# Patient Record
Sex: Male | Born: 1990 | Race: Black or African American | Hispanic: No | Marital: Single | Smoking: Current some day smoker
Health system: Southern US, Community
[De-identification: ages and names within clinical notes are randomized; demographics above are authoritative.]

## PROBLEM LIST (undated history)

## (undated) DIAGNOSIS — M419 Scoliosis, unspecified: Secondary | ICD-10-CM

---

## 2015-12-02 ENCOUNTER — Emergency Department (HOSPITAL_COMMUNITY): Payer: 59

## 2015-12-02 ENCOUNTER — Encounter (HOSPITAL_COMMUNITY): Payer: Self-pay | Admitting: Emergency Medicine

## 2015-12-02 ENCOUNTER — Emergency Department (HOSPITAL_COMMUNITY)
Admission: EM | Admit: 2015-12-02 | Discharge: 2015-12-02 | Disposition: A | Discharge status: Home / Self Care | Payer: 59 | Attending: Emergency Medicine | Admitting: Emergency Medicine

## 2015-12-02 DIAGNOSIS — W500XXA Accidental hit or strike by another person, initial encounter: Secondary | ICD-10-CM | POA: Insufficient documentation

## 2015-12-02 DIAGNOSIS — S99911A Unspecified injury of right ankle, initial encounter: Secondary | ICD-10-CM | POA: Diagnosis present

## 2015-12-02 DIAGNOSIS — S93401A Sprain of unspecified ligament of right ankle, initial encounter: Secondary | ICD-10-CM | POA: Diagnosis not present

## 2015-12-02 DIAGNOSIS — Y929 Unspecified place or not applicable: Secondary | ICD-10-CM | POA: Insufficient documentation

## 2015-12-02 DIAGNOSIS — F17200 Nicotine dependence, unspecified, uncomplicated: Secondary | ICD-10-CM | POA: Insufficient documentation

## 2015-12-02 DIAGNOSIS — Y998 Other external cause status: Secondary | ICD-10-CM | POA: Insufficient documentation

## 2015-12-02 DIAGNOSIS — Y9361 Activity, american tackle football: Secondary | ICD-10-CM | POA: Diagnosis not present

## 2015-12-02 NOTE — ED Triage Notes (Signed)
Pt from home following an ankle injury that he incurred while playing football around 2100. Pt states he had his weight on his right foot and was hit by other players. Pt states the lateral side of his right ankle is where his pain is localized. Pt rates his pain at 8/10. Pt states he is unable to bear weight on that foot

## 2015-12-02 NOTE — Discharge Instructions (Signed)
You can take ibuprofen, available over the counter, for pain.

## 2015-12-02 NOTE — ED Notes (Signed)
Bed: WA20 Expected date:  Expected time:  Means of arrival:  Comments: 

## 2015-12-02 NOTE — ED Provider Notes (Signed)
[facility]-EMERGENCY DEPT Provider Note   CSN: [id number] Arrival date & time: 12/02/15  [id number]  By signing my name below, I, Soijett Blue, attest that this documentation has been prepared under the direction and in the presence of Tilden FossaElizabeth Yamaris Cummings, MD. Electronically Signed: Soijett Blue, ED Scribe. 12/02/15. 1:20 AM.   History   Chief Complaint Chief Complaint  Patient presents with  . Ankle Injury    HPI Jesus HarpsXavier Moster is a 25 y.o. male who presents to the Emergency Department complaining of right ankle injury occurring 9 PM yesterday. Pt notes that he was playing football when he planted his feet to cut quickly when he was tackled. Pt reports that his right ankle pain is localized to the lateral side. Pt states that he has injured this ankle in the past with a similar mechanism. Pt is having associated symptoms of gait problem due to pain and mild right ankle swelling. He notes that he has tried ice without medications for the relief of his symptoms. He denies right knee pain, color change, wound, rash, and any other symptoms. Denies ever having surgery in the past. Pt denies having an orthopedist.   The history is provided by the patient. No language interpreter was used.    History reviewed. No pertinent past medical history.  There are no active problems to display for this patient.   History reviewed. No pertinent surgical history.     Home Medications    Prior to Admission medications   Not on File    Family History No family history on file.  Social History Social History  Substance Use Topics  . Smoking status: Current Some Day Smoker  . Smokeless tobacco: Never Used  . Alcohol use Yes     Comment: socially     Allergies   Review of patient's allergies indicates no known allergies.   Review of Systems Review of Systems  Musculoskeletal: Positive for arthralgias (right ankle), gait problem (due to pain) and joint swelling (right ankle).  Skin: Negative for  color change, rash and wound.     Physical Exam Updated Vital Signs BP 122/61 (BP Location: Right Arm)   Pulse 68   Temp 98.9 F (37.2 C) (Oral)   Resp 13   Ht 5\' 8"  (1.727 m)   Wt 162 lb (73.5 kg)   SpO2 98%   BMI 24.63 kg/m   Physical Exam  Constitutional: He is oriented to person, place, and time. He appears well-developed and well-nourished. No distress.  HENT:  Head: Normocephalic and atraumatic.  Neck: Neck supple.  Cardiovascular: Normal rate and regular rhythm.   Pulmonary/Chest: Effort normal. No respiratory distress.  Musculoskeletal:  2+ DP pulses bilaterally. Mild swelling and tenderness to the right, lateral malleolus. Dorsi flexion and plantar flexion intact to the right foot and ankle. No tenderness to the knee.  Neurological: He is alert and oriented to person, place, and time.  Sensation to light touch intact throughout the foot. Wiggles toes.  Skin: Skin is warm and dry. Capillary refill takes less than 2 seconds.  Nursing note and vitals reviewed.    ED Treatments / Results  DIAGNOSTIC STUDIES: Oxygen Saturation is 98% on RA, nl by my interpretation.    COORDINATION OF CARE: 1:15 AM Discussed treatment plan with pt at bedside which includes right ankle xray, RICE, crutches, ibuprofen, referral and follow up with orthopedist, and pt agreed to plan.   Radiology Dg Ankle Complete Right  Result Date: 12/02/2015 CLINICAL DATA:  Football injury. Lateral  right ankle pain after twisting injury. Previous history of sprained right ankles. EXAM: RIGHT ANKLE - COMPLETE 3+ VIEW COMPARISON:  None. FINDINGS: There is no evidence of fracture, dislocation, or joint effusion. There is no evidence of arthropathy or other focal bone abnormality. Soft tissues are unremarkable. IMPRESSION: Negative. Electronically Signed   By: Burman Nieves M.D.   On: 12/02/2015 01:21    Procedures Procedures (including critical care time)  Medications Ordered in ED Medications - No  data to display   Initial Impression / Assessment and Plan / ED Course  I have reviewed the triage vital signs and the nursing notes.  Pertinent imaging results that were available during my care of the patient were reviewed by me and considered in my medical decision making (see chart for details).  Clinical Course    Patient here for evaluation of right ankle pain following an eversion injury. He has tenderness to the lateral malleolus with mild local swelling. Plain films are negative for fracture. Will treat for sprain. Discussed home care, outpatient follow-up, return precautions.  Final Clinical Impressions(s) / ED Diagnoses   Final diagnoses:  Right ankle sprain, initial encounter    New Prescriptions There are no discharge medications for this patient.  I personally performed the services described in this documentation, which was scribed in my presence. The recorded information has been reviewed and is accurate.    Tilden Fossa, MD 12/02/15 [phone]

## 2016-10-19 ENCOUNTER — Emergency Department (HOSPITAL_COMMUNITY): Payer: 59

## 2016-10-19 ENCOUNTER — Encounter (HOSPITAL_COMMUNITY): Payer: Self-pay | Admitting: Emergency Medicine

## 2016-10-19 ENCOUNTER — Emergency Department (HOSPITAL_COMMUNITY)
Admission: EM | Admit: 2016-10-19 | Discharge: 2016-10-19 | Disposition: A | Discharge status: Home / Self Care | Payer: 59 | Attending: Emergency Medicine | Admitting: Emergency Medicine

## 2016-10-19 DIAGNOSIS — R42 Dizziness and giddiness: Secondary | ICD-10-CM | POA: Insufficient documentation

## 2016-10-19 DIAGNOSIS — R110 Nausea: Secondary | ICD-10-CM | POA: Insufficient documentation

## 2016-10-19 DIAGNOSIS — R51 Headache: Secondary | ICD-10-CM | POA: Insufficient documentation

## 2016-10-19 DIAGNOSIS — W500XXA Accidental hit or strike by another person, initial encounter: Secondary | ICD-10-CM | POA: Insufficient documentation

## 2016-10-19 DIAGNOSIS — H538 Other visual disturbances: Secondary | ICD-10-CM | POA: Insufficient documentation

## 2016-10-19 DIAGNOSIS — F17210 Nicotine dependence, cigarettes, uncomplicated: Secondary | ICD-10-CM | POA: Insufficient documentation

## 2016-10-19 DIAGNOSIS — S060X0A Concussion without loss of consciousness, initial encounter: Secondary | ICD-10-CM

## 2016-10-19 DIAGNOSIS — Y9361 Activity, american tackle football: Secondary | ICD-10-CM | POA: Insufficient documentation

## 2016-10-19 DIAGNOSIS — M542 Cervicalgia: Secondary | ICD-10-CM | POA: Insufficient documentation

## 2016-10-19 DIAGNOSIS — Y999 Unspecified external cause status: Secondary | ICD-10-CM | POA: Insufficient documentation

## 2016-10-19 DIAGNOSIS — Y92321 Football field as the place of occurrence of the external cause: Secondary | ICD-10-CM | POA: Insufficient documentation

## 2016-10-19 MED ORDER — SODIUM CHLORIDE 0.9 % IV BOLUS (SEPSIS)
1000.0000 mL | Freq: Once | INTRAVENOUS | Status: AC
  Administered 2016-10-19: 1000 mL via INTRAVENOUS

## 2016-10-19 MED ORDER — METOCLOPRAMIDE HCL 5 MG/ML IJ SOLN
10.0000 mg | Freq: Once | INTRAMUSCULAR | Status: AC
  Administered 2016-10-19: 10 mg via INTRAVENOUS
  Filled 2016-10-19: qty 2

## 2016-10-19 MED ORDER — KETOROLAC TROMETHAMINE 30 MG/ML IJ SOLN
15.0000 mg | Freq: Once | INTRAMUSCULAR | Status: DC
  Filled 2016-10-19: qty 1

## 2016-10-19 MED ORDER — DIPHENHYDRAMINE HCL 50 MG/ML IJ SOLN
12.5000 mg | Freq: Once | INTRAMUSCULAR | Status: AC
  Administered 2016-10-19: 12.5 mg via INTRAVENOUS
  Filled 2016-10-19: qty 1

## 2016-10-19 NOTE — ED Triage Notes (Signed)
Pt c/o headache, neck pain and stiffness, bilateral blurred vision throughout visual field, dizziness intermittent nausea onset yesterday during football game last night around 2130. Family member reports pt struck another player's chest with his head, then pt's head "whipped back," hit looked hard. Pt's symptoms started sometime after that. No LOC. Multiple episodes emesis with mid to upper abdominal pain yesterday morning before game.  Hx 9 prior concussions. Decreased hearing right ear compared to left. CN II-XII otherwise intact. Sensation intact and symmetrical.

## 2016-10-19 NOTE — Discharge Instructions (Signed)
Make sure you call the concussion clinic hotline [phone].  You have been diagnosed with a concussion.  Ibuprofen or Tylenol for pain Rest, ice on head.  Stay in a quiet, non-simulating, dark environment. No TV, computer use, video games until headache is resolved completely. No contact sports until cleared by the clinic Follow up with your primary care physician if headache persists.  Return to the emergency department if patient becomes lethargic, begins vomiting or other change in mental status.  HEAD INJURY If any of the following occur notify your physician or go to the Hospital Emergency Department:  Increased drowsiness, stupor or loss of consciousness  Temperature above 100 F  Vomiting  Severe headache  Blood or clear fluid dripping from the nose or ears  Stiffness of the neck  Dizziness or blurred vision  Any other unusual symptoms  PRECAUTIONS  Keep head elevated at all times for the first 24 hours (Elevate mattress if pillow is ineffective)  Do not take sedatives, narcotics or alcohol  Avoid aspirin. Use only acetaminophen (e.g. Tylenol) or ibuprofen (e.g. Advil) for relief of pain. Follow directions on the bottle for dosage.  Use ice packs for comfort

## 2016-10-19 NOTE — ED Provider Notes (Signed)
[facility]-EMERGENCY DEPT Provider Note   CSN: [id number] Arrival date & time: 10/19/16  1238     History   Chief Complaint Chief Complaint  Patient presents with  . Headache  . Head Injury    HPI Jesus Holloway is a 26 y.o. male.  HPI 26 year old American male past medical history is significant for concussion presents to the ED today with concussion-like symptoms complaints. Patient states that he was playing football yesterday when he struck another player's chest with his head. States that "his head whipped back". Patient reports intermittent headaches, intermittent blurred vision, dizziness, nausea. Patient has a history of concussions and states this feels similar. He denies any LOC. History of benign prior concussions. He reports several episodes of nonbloody nonbloody emesis yesterday but denies any emesis today. Reports nausea. He does report some decreased hearing out of his right ear compared to the left. Patient complains of neck soreness and stiffness. He is not taking any vertigo symptoms at home prior to arrival. Nothing makes better or worse. States the symptoms have slightly subsided since arriving to the ED. Does report headache persisting.  Pt denies any fever, chill, ha, vision changes, lightheadedness, congestion, neck pain, cp, sob, cough, abd pain, n/v/d, urinary symptoms, change in bowel habits, melena, hematochezia, lower extremity paresthesias, back pain, saddle paresthesias, urinary retention, loss of bowel or bladder.  History reviewed. No pertinent past medical history.  There are no active problems to display for this patient.   History reviewed. No pertinent surgical history.     Home Medications    Prior to Admission medications   Not on File    Family History History reviewed. No pertinent family history.  Social History Social History  Substance Use Topics  . Smoking status: Current Some Day Smoker  . Smokeless tobacco: Never Used  . Alcohol  use Yes     Comment: socially     Allergies   Patient has no known allergies.   Review of Systems Review of Systems  Constitutional: Negative for chills and fever.  HENT: Negative for congestion and sore throat.   Eyes: Positive for photophobia and visual disturbance.  Respiratory: Negative for cough and shortness of breath.   Cardiovascular: Negative for chest pain.  Gastrointestinal: Positive for nausea and vomiting. Negative for abdominal pain and diarrhea.  Genitourinary: Negative for dysuria, flank pain, frequency, hematuria, scrotal swelling, testicular pain and urgency.  Musculoskeletal: Positive for neck pain and neck stiffness. Negative for arthralgias, back pain, gait problem, joint swelling and myalgias.  Skin: Negative for rash.  Neurological: Positive for dizziness and headaches. Negative for syncope, weakness, light-headedness and numbness.  Psychiatric/Behavioral: Negative for sleep disturbance. The patient is not nervous/anxious.      Physical Exam Updated Vital Signs BP 128/81 (BP Location: Left Arm)   Pulse (!) 54   Temp 98 F (36.7 C) (Oral)   Resp 16   SpO2 99%   Physical Exam  Constitutional: He is oriented to person, place, and time. He appears well-developed and well-nourished.  Non-toxic appearance. No distress.  HENT:  Head: Normocephalic and atraumatic.  Mouth/Throat: Oropharynx is clear and moist.  No bilateral hemotympanum or septal hematoma.  Eyes: Pupils are equal, round, and reactive to light. Conjunctivae and EOM are normal. Right eye exhibits no discharge. Left eye exhibits no discharge.  Neck: Normal range of motion. Neck supple.  No c spine midline tenderness. Bilateral paraspinal tenderness. No deformities or step offs noted. Full ROM. Supple. No nuchal rigidity.  Cardiovascular: Normal rate, regular rhythm, normal heart sounds and intact distal pulses.  Exam reveals no gallop and no friction rub.   No murmur heard. Pulmonary/Chest:  Effort normal and breath sounds normal. No respiratory distress. He exhibits no tenderness.  Abdominal: Soft. Bowel sounds are normal. He exhibits no distension. There is no tenderness. There is no rebound and no guarding.  Musculoskeletal: Normal range of motion. He exhibits no tenderness.  No midline T spine or L spine tenderness. No deformities or step offs noted. Full ROM. Pelvis is stable.   Lymphadenopathy:    He has no cervical adenopathy.  Neurological: He is alert and oriented to person, place, and time.  The patient is alert, attentive, and oriented x 3. Speech is clear. Cranial nerve II-VII grossly intact. Negative pronator drift. Sensation intact. Strength 5/5 in all extremities. Reflexes 2+ and symmetric at biceps, triceps, knees, and ankles. Rapid alternating movement and fine finger movements intact. Romberg is absent. Posture and gait normal.   Skin: Skin is warm and dry. Capillary refill takes less than 2 seconds. No rash noted.  Psychiatric: His behavior is normal. Judgment and thought content normal.  Nursing note and vitals reviewed.    ED Treatments / Results  Labs (all labs ordered are listed, but only abnormal results are displayed) Labs Reviewed - No data to display  EKG  EKG Interpretation None       Radiology Ct Head Wo Contrast  Result Date: 10/19/2016 CLINICAL DATA:  Headache neck pain, stiffness and blurred vision which began yesterday while playing football. EXAM: CT HEAD WITHOUT CONTRAST CT CERVICAL SPINE WITHOUT CONTRAST TECHNIQUE: Multidetector CT imaging of the head and cervical spine was performed following the standard protocol without intravenous contrast. Multiplanar CT image reconstructions of the cervical spine were also generated. COMPARISON:  None. FINDINGS: CT HEAD FINDINGS Brain: Appears normal without hemorrhage, infarct, mass lesion, mass effect, midline shift or abnormal extra-axial fluid collection. No hydrocephalus or pneumocephalus.  Vascular: Negative. Skull: And intact. Sinuses/Orbits: Negative. Other: None. CT CERVICAL SPINE FINDINGS Alignment: Normal. Skull base and vertebrae: No acute fracture. No primary bone lesion or focal pathologic process. Soft tissues and spinal canal: No prevertebral fluid or swelling. No visible canal hematoma. Disc levels:  Disc height is maintained throughout. Upper chest: Negative. Other: None. IMPRESSION: Negative head and cervical spine CT scans. Electronically Signed   By: Drusilla Kanner M.D.   On: 10/19/2016 14:04   Ct Cervical Spine Wo Contrast  Result Date: 10/19/2016 CLINICAL DATA:  Headache neck pain, stiffness and blurred vision which began yesterday while playing football. EXAM: CT HEAD WITHOUT CONTRAST CT CERVICAL SPINE WITHOUT CONTRAST TECHNIQUE: Multidetector CT imaging of the head and cervical spine was performed following the standard protocol without intravenous contrast. Multiplanar CT image reconstructions of the cervical spine were also generated. COMPARISON:  None. FINDINGS: CT HEAD FINDINGS Brain: Appears normal without hemorrhage, infarct, mass lesion, mass effect, midline shift or abnormal extra-axial fluid collection. No hydrocephalus or pneumocephalus. Vascular: Negative. Skull: And intact. Sinuses/Orbits: Negative. Other: None. CT CERVICAL SPINE FINDINGS Alignment: Normal. Skull base and vertebrae: No acute fracture. No primary bone lesion or focal pathologic process. Soft tissues and spinal canal: No prevertebral fluid or swelling. No visible canal hematoma. Disc levels:  Disc height is maintained throughout. Upper chest: Negative. Other: None. IMPRESSION: Negative head and cervical spine CT scans. Electronically Signed   By: Drusilla Kanner M.D.   On: 10/19/2016 14:04    Procedures Procedures (including critical care time)  Medications Ordered in ED Medications  ketorolac (TORADOL) 30 MG/ML injection 15 mg (15 mg Intravenous Not Given 10/19/16 1556)  sodium chloride 0.9  % bolus 1,000 mL (0 mLs Intravenous Stopped 10/19/16 1556)  metoCLOPramide (REGLAN) injection 10 mg (10 mg Intravenous Given 10/19/16 1423)  diphenhydrAMINE (BENADRYL) injection 12.5 mg (12.5 mg Intravenous Given 10/19/16 1423)     Initial Impression / Assessment and Plan / ED Course  I have reviewed the triage vital signs and the nursing notes.  Pertinent labs & imaging results that were available during my care of the patient were reviewed by me and considered in my medical decision making (see chart for details).     Patient symptoms consistent with concussion. No vomiting. No focal neurological deficits on physical exam.  Pt observed in the ED. Accident occurred yesterday. CT scan shows no acute abnormalities Discussed symptoms of post concussive syndrome and reasons to return to the emergency department including any new  severe headaches, disequilibrium, vomiting, double vision, extremity weakness, difficulty ambulating, or any other concerning symptoms. Patient will be discharged with information pertaining to diagnosis. Pt advised to avoid all contact sports and will need PCP clearance to return to PE and sports.  Pt is safe for discharge at this time.   Final Clinical Impressions(s) / ED Diagnoses   Final diagnoses:  Concussion without loss of consciousness, initial encounter    New Prescriptions There are no discharge medications for this patient.    Rise Mu, PA-C 10/19/16 1603    Vanetta Mulders, MD 10/20/16 [phone]

## 2017-07-04 ENCOUNTER — Emergency Department (HOSPITAL_COMMUNITY)
Admission: EM | Admit: 2017-07-04 | Discharge: 2017-07-04 | Disposition: A | Discharge status: Home / Self Care | Payer: BLUE CROSS/BLUE SHIELD | Attending: Emergency Medicine | Admitting: Emergency Medicine

## 2017-07-04 ENCOUNTER — Encounter (HOSPITAL_COMMUNITY): Payer: Self-pay | Admitting: Emergency Medicine

## 2017-07-04 DIAGNOSIS — K0889 Other specified disorders of teeth and supporting structures: Secondary | ICD-10-CM | POA: Diagnosis present

## 2017-07-04 DIAGNOSIS — F17210 Nicotine dependence, cigarettes, uncomplicated: Secondary | ICD-10-CM | POA: Insufficient documentation

## 2017-07-04 MED ORDER — PENICILLIN V POTASSIUM 500 MG PO TABS: 500.0000 mg | ORAL_TABLET | Freq: Four times a day (QID) | ORAL | 0 refills | Status: AC

## 2017-07-04 NOTE — Discharge Instructions (Addendum)
Take antibiotic as prescribed.  You can take Aleve 500 mg twice a day for pain.  Please use dental resource guide to find a local dentist to follow-up with.  Your blood pressure was elevated in the ER today, please have this rechecked by her primary doctor.  As we discussed, return to the emergency department for any new or worsening symptoms including fever, trouble breathing or swallowing, neck swelling/pain.

## 2017-07-04 NOTE — ED Triage Notes (Signed)
Pt c/o left lower dental pain that has been on going for over 3 weeks. reports that tooth is cracked.

## 2017-07-04 NOTE — ED Provider Notes (Signed)
[facility]-EMERGENCY DEPT Provider Note   CSN: [id number] Arrival date & time: 07/04/17  1124     History   Chief Complaint Chief Complaint  Patient presents with  . Dental Pain    HPI Jesus Holloway is a 27 y.o. male.  HPI   Jesus Holloway is a 27 year old male with no syncope past medical history who presents to the emergency department for evaluation of left lower dental pain.  Patient reports that he chipped 1 of his left lower molars 3 months ago.  Did not start developing pain until about 2 weeks ago which has been gradually getting worse.  He states that he has 7/10 severity, "throbbing" left lower dental pain.  It is worsened with chewing, or cold air.  She has been taking Aleve, ibuprofen, Goody's powder, applying Orajel and has had only temporary relief.  He states that he has Jesus Holloway, but does not have a dentist.  He denies fevers, chills, neck pain/swelling, trismus, trouble breathing or swallowing, nausea/vomiting.   History reviewed. No pertinent past medical history.  There are no active problems to display for this patient.   History reviewed. No pertinent surgical history.      Home Medications    Prior to Admission medications   Not on File    Family History No family history on file.  Social History Social History   Tobacco Use  . Smoking status: Current Some Day Smoker    Types: Cigarettes  . Smokeless tobacco: Never Used  Substance Use Topics  . Alcohol use: Yes    Comment: socially  . Drug use: Not on file     Allergies   Patient has no known allergies.   Review of Systems Review of Systems  Constitutional: Negative for chills and fever.  HENT: Positive for dental problem. Negative for facial swelling and trouble swallowing.   Respiratory: Negative for shortness of breath.   Gastrointestinal: Negative for nausea and vomiting.     Physical Exam Updated Vital Signs BP (!) 142/81 (BP Location: Right  Arm)   Pulse 60   Temp 97.9 F (36.6 C) (Oral)   Resp 18   Ht 5\' 8"  (1.727 m)   Wt 78.5 kg (173 lb)   SpO2 100%   BMI 26.30 kg/m   Physical Exam  Constitutional: He appears well-developed and well-nourished. No distress.  Sitting at bedside in no apparent distress, nontoxic.  HENT:  Head: Normocephalic and atraumatic.  Mouth/Throat: Oropharynx is clear and moist. No oropharyngeal exudate.    Dental cavities and poor oral dentition noted. Pain along tooth which is cracked as depicted in image. No abscess noted. Midline uvula. No trismus. OP moist and clear. No oropharyngeal erythema or edema. Neck supple with no tenderness. No facial edema.  Eyes: Right eye exhibits no discharge. Left eye exhibits no discharge.  Neck: Normal range of motion. Neck supple.  Pulmonary/Chest: Effort normal. No respiratory distress.  Neurological: He is alert. Coordination normal.  Skin: He is not diaphoretic.  Psychiatric: He has a normal mood and affect. His behavior is normal.  Nursing note and vitals reviewed.    ED Treatments / Results  Labs (all labs ordered are listed, but only abnormal results are displayed) Labs Reviewed - No data to display  EKG None  Radiology No results found.  Procedures Procedures (including critical care time)  Medications Ordered in ED Medications - No data to display   Initial Impression / Assessment and Plan / ED Course  I have reviewed the triage vital signs and the nursing notes.  Pertinent labs & imaging results that were available during my care of the patient were reviewed by me and considered in my medical decision making (see chart for details).     Patient with toothache.  No trouble breathing or swallowing.  No gross abscess.  Exam unconcerning for Ludwig's angina, PTA or spread of infection.  Will treat with penicillin and anti-inflammatories medicine.  Urged patient to follow-up with dentist and provided with dental resource guide with his  discharge paperwork.  His blood pressure is mildly elevated, counseled him to have this rechecked with his primary care doctor.  Discussed reasons to return to the emergency department.  Patient agrees and voiced understanding the above plan and appears reliable to follow-up.  Final Clinical Impressions(s) / ED Diagnoses   Final diagnoses:  Pain, dental    ED Discharge Orders        Ordered    penicillin v potassium (VEETID) 500 MG tablet  4 times daily     07/04/17 1430       Kellie ShropshireShrosbree, Jinnie Onley J, [place] JerseyPA-C 07/04/17 1431    Tegeler, Canary Brimhristopher J, MD 07/04/17 [phone]

## 2018-07-28 IMAGING — CT CT CERVICAL SPINE W/O CM
4 of 8 series · 12 of 33 positions shown, 13 images · non-contrast
Comparison: None.

CLINICAL DATA: Headache neck pain, stiffness and blurred vision
which began yesterday while playing football.

EXAM:
CT HEAD WITHOUT CONTRAST
CT CERVICAL SPINE WITHOUT CONTRAST
TECHNIQUE: Multidetector CT imaging of the head and cervical spine was
performed following the standard protocol without intravenous
contrast. Multiplanar CT image reconstructions of the cervical spine
were also generated.

[Series 6: sagittal · sagittal · 0.31mm/px · 5 of 74 slices shown]
[im 13/74  bone]
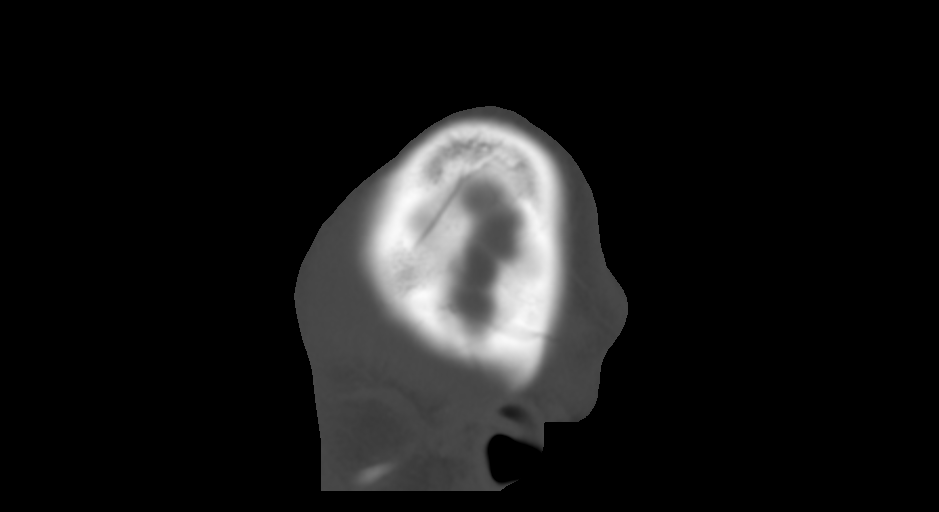
[im 25/74  bone]
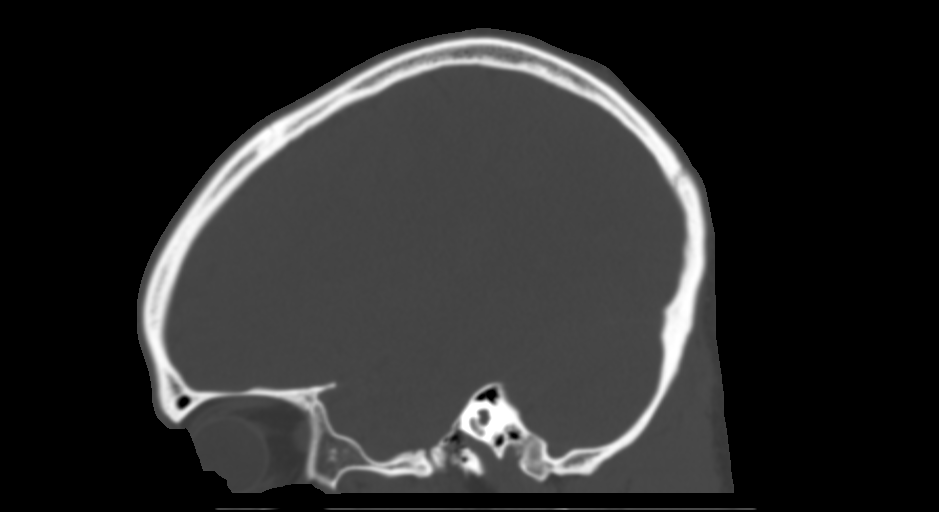
[im 37/74  bone]
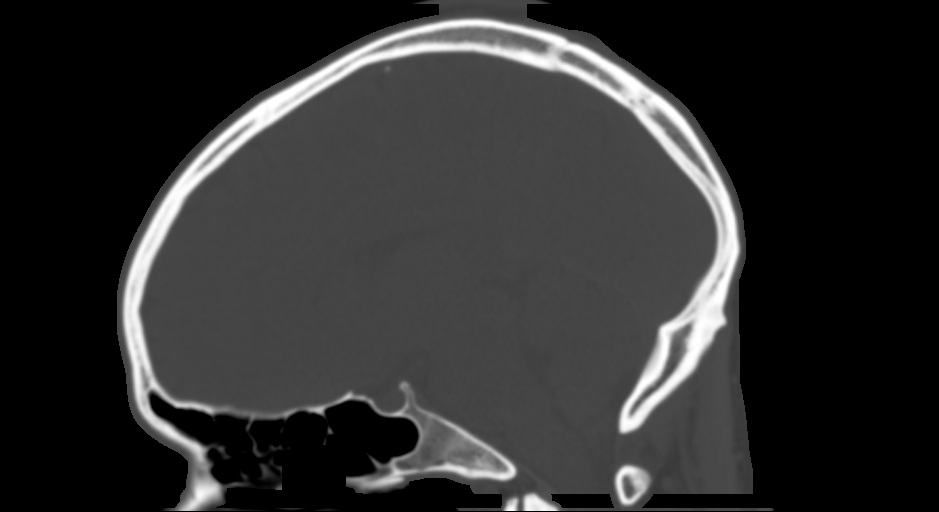
[im 49/74  bone]
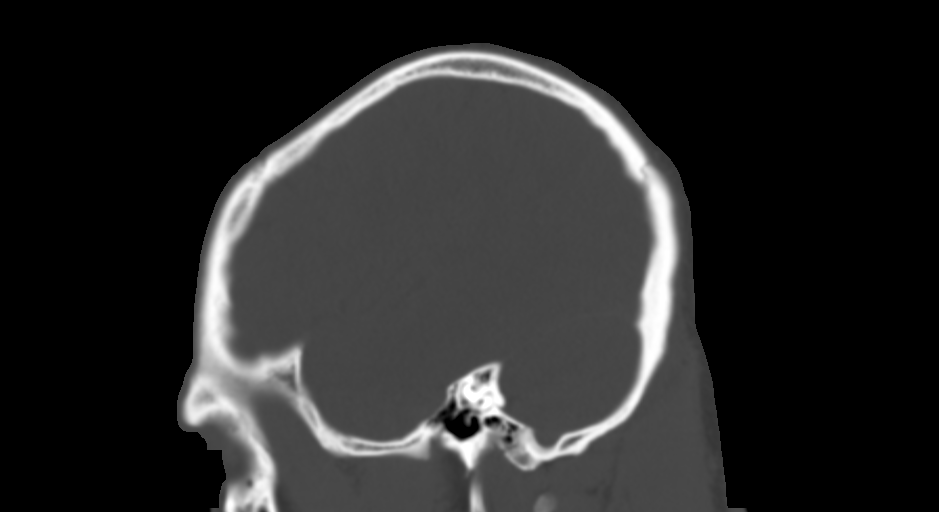
[im 61/74  bone]
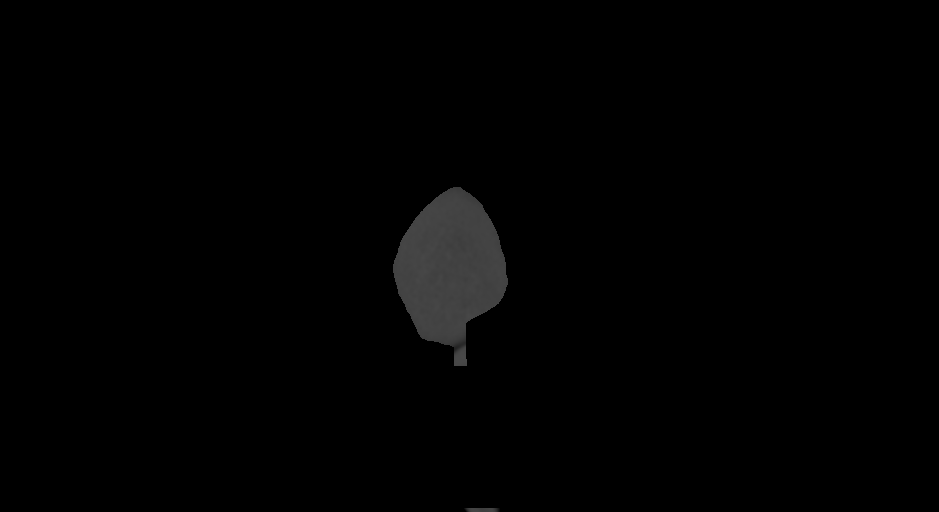

[Series 7: c-spine st · axial · 0.29mm/px · z∈[+1176,+1256]mm · 3 of 81 slices shown, 4 images]
[im 21/81  soft-tissue]
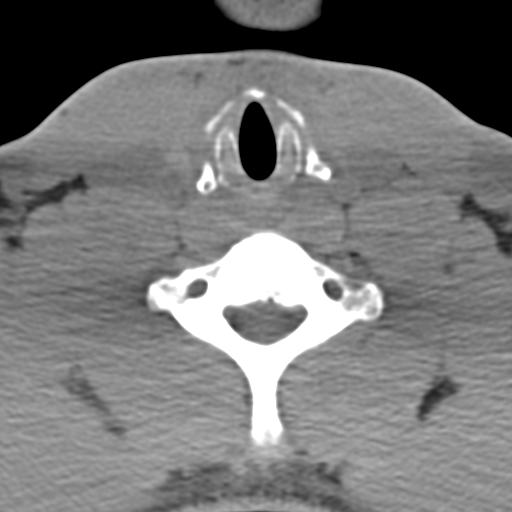
[im 21/81  bone]
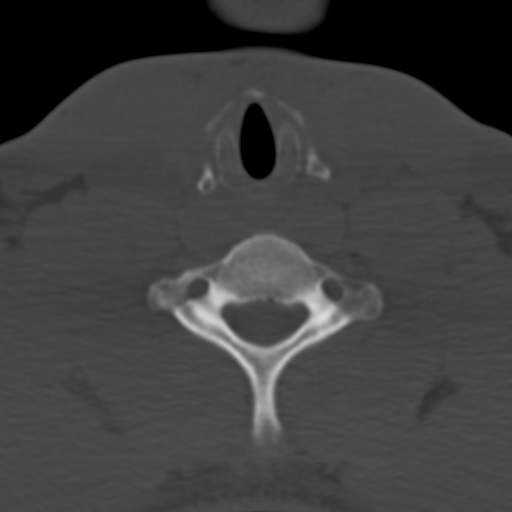
[im 41/81  bone]
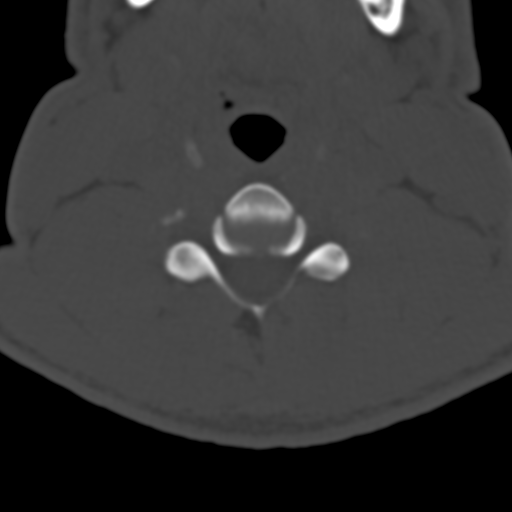
[im 61/81  bone]
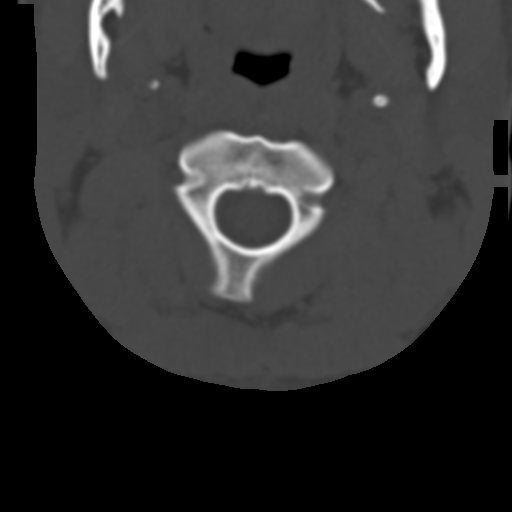

[Series 10: axial recon · axial · 0.23mm/px · z∈[+1162,+1246]mm · 3 of 87 slices shown]
[im 22/87  bone]
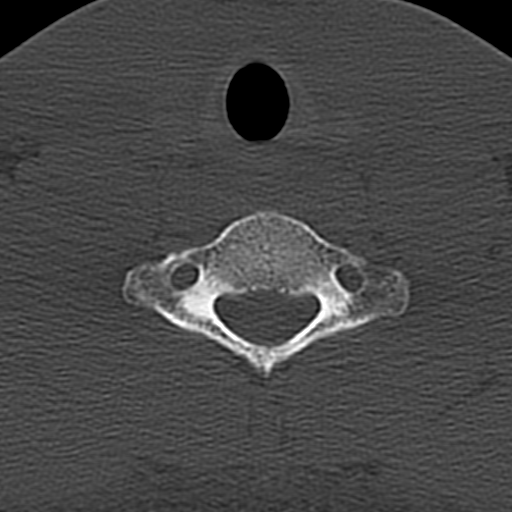
[im 44/87  bone]
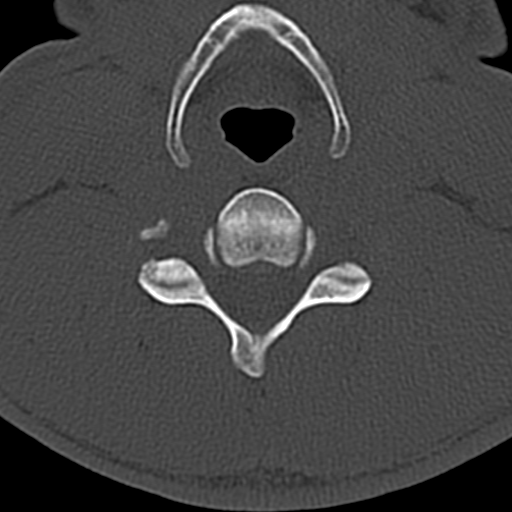
[im 65/87  bone]
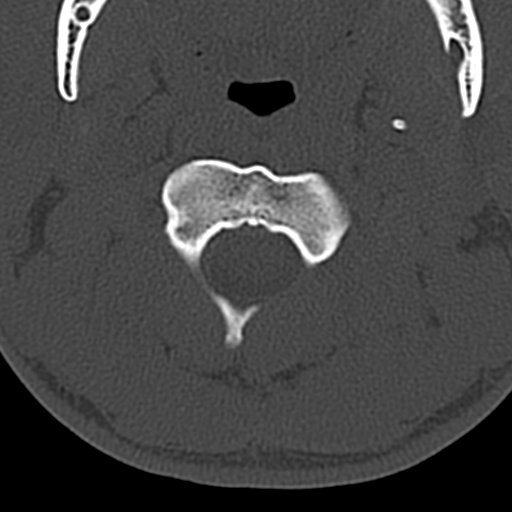

[Series 11: coronal · coronal · 0.23mm/px · 1 of 61 slices shown]
[im 31/61  bone]
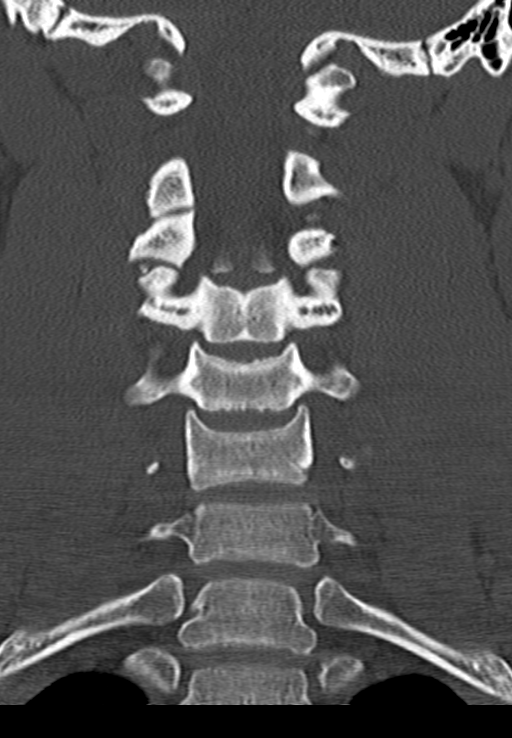

[12 of 33 positions shown; findings below may reference images not displayed]

FINDINGS: CT HEAD FINDINGS

Brain: Appears normal without hemorrhage, infarct, mass lesion, mass
effect, midline shift or abnormal extra-axial fluid collection. No
hydrocephalus or pneumocephalus.

Vascular: Negative.

Skull: And intact.

Sinuses/Orbits: Negative.

Other: None.

CT CERVICAL SPINE FINDINGS

Alignment: Normal.

Skull base and vertebrae: No acute fracture. No primary bone lesion
or focal pathologic process.

Soft tissues and spinal canal: No prevertebral fluid or swelling. No
visible canal hematoma.

Disc levels:  Disc height is maintained throughout.

Upper chest: Negative.

Other: None.
IMPRESSION: Negative head and cervical spine CT scans.

## 2019-01-04 ENCOUNTER — Encounter (HOSPITAL_COMMUNITY): Payer: Self-pay

## 2019-01-04 ENCOUNTER — Ambulatory Visit (HOSPITAL_COMMUNITY)
Admission: EM | Admit: 2019-01-04 | Discharge: 2019-01-04 | Disposition: A | Discharge status: Home / Self Care | Payer: Managed Care, Other (non HMO) | Attending: Emergency Medicine | Admitting: Emergency Medicine

## 2019-01-04 DIAGNOSIS — R197 Diarrhea, unspecified: Secondary | ICD-10-CM | POA: Diagnosis not present

## 2019-01-04 DIAGNOSIS — Z20828 Contact with and (suspected) exposure to other viral communicable diseases: Secondary | ICD-10-CM | POA: Diagnosis not present

## 2019-01-04 DIAGNOSIS — R112 Nausea with vomiting, unspecified: Secondary | ICD-10-CM | POA: Insufficient documentation

## 2019-01-04 DIAGNOSIS — M791 Myalgia, unspecified site: Secondary | ICD-10-CM | POA: Diagnosis not present

## 2019-01-04 DIAGNOSIS — M549 Dorsalgia, unspecified: Secondary | ICD-10-CM | POA: Diagnosis not present

## 2019-01-04 DIAGNOSIS — F17210 Nicotine dependence, cigarettes, uncomplicated: Secondary | ICD-10-CM | POA: Diagnosis not present

## 2019-01-04 DIAGNOSIS — R51 Headache: Secondary | ICD-10-CM | POA: Insufficient documentation

## 2019-01-04 DIAGNOSIS — Z79899 Other long term (current) drug therapy: Secondary | ICD-10-CM | POA: Diagnosis not present

## 2019-01-04 DIAGNOSIS — Z20822 Contact with and (suspected) exposure to covid-19: Secondary | ICD-10-CM

## 2019-01-04 MED ORDER — ONDANSETRON HCL 4 MG PO TABS: 4.0000 mg | ORAL_TABLET | Freq: Four times a day (QID) | ORAL | 0 refills | Status: DC

## 2019-01-04 NOTE — ED Provider Notes (Signed)
[facility]    CSN: [id number] Arrival date & time: 01/04/19  1841      History   Chief Complaint Chief Complaint  Patient presents with  . Emesis  . Diarrhea  . Headache  . Back Pain    HPI Jesus Holloway is a 28 y.o. male.   Patient presents with 1 week history of vomiting, diarrhea, headache, and body aches.  He reports 1 episode of vomiting daily in the morning and 2-3 episodes of diarrhea daily.  He is staying hydrated with water and Gatorade without difficulty.  He denies fever, chills, cough, shortness of breath, abdominal pain, or other symptoms.  The history is provided by the patient.    History reviewed. No pertinent past medical history.  There are no active problems to display for this patient.   History reviewed. No pertinent surgical history.     Home Medications    Prior to Admission medications   Medication Sig Start Date End Date Taking? Authorizing Provider  ondansetron (ZOFRAN) 4 MG tablet Take 1 tablet (4 mg total) by mouth every 6 (six) hours. 01/04/19   Mickie Bail, NP    Family History History reviewed. No pertinent family history.  Social History Social History   Tobacco Use  . Smoking status: Current Some Day Smoker    Types: Cigarettes  . Smokeless tobacco: Never Used  Substance Use Topics  . Alcohol use: Yes    Comment: socially  . Drug use: Not on file     Allergies   Patient has no known allergies.   Review of Systems Review of Systems  Constitutional: Negative for chills and fever.  HENT: Negative for congestion, ear pain, rhinorrhea and sore throat.   Eyes: Negative for pain and visual disturbance.  Respiratory: Negative for cough and shortness of breath.   Cardiovascular: Negative for chest pain and palpitations.  Gastrointestinal: Positive for diarrhea, nausea and vomiting. Negative for abdominal pain.  Genitourinary: Negative for dysuria and hematuria.  Musculoskeletal: Negative for arthralgias and  back pain.  Skin: Negative for color change and rash.  Neurological: Positive for headaches. Negative for seizures and syncope.  All other systems reviewed and are negative.    Physical Exam Triage Vital Signs ED Triage Vitals  Enc Vitals Group     BP      Pulse      Resp      Temp      Temp src      SpO2      Weight      Height      Head Circumference      Peak Flow      Pain Score      Pain Loc      Pain Edu?      Excl. in GC?    No data found.  Updated Vital Signs BP 117/71 (BP Location: Left Arm)   Pulse (!) 57   Temp 98.1 F (36.7 C) (Temporal)   Resp 14   Visual Acuity Right Eye Distance:   Left Eye Distance:   Bilateral Distance:    Right Eye Near:   Left Eye Near:    Bilateral Near:     Physical Exam Vitals signs and nursing note reviewed.  Constitutional:      Appearance: He is well-developed.  HENT:     Head: Normocephalic and atraumatic.     Right Ear: Tympanic membrane normal.     Left Ear: Tympanic membrane normal.  Nose: Nose normal.     Mouth/Throat:     Mouth: Mucous membranes are moist.     Pharynx: Oropharynx is clear.  Eyes:     Conjunctiva/sclera: Conjunctivae normal.  Neck:     Musculoskeletal: Neck supple.  Cardiovascular:     Rate and Rhythm: Normal rate and regular rhythm.     Heart sounds: No murmur.  Pulmonary:     Effort: Pulmonary effort is normal. No respiratory distress.     Breath sounds: Normal breath sounds.  Abdominal:     General: Bowel sounds are normal.     Palpations: Abdomen is soft.     Tenderness: There is no abdominal tenderness. There is no guarding or rebound.  Skin:    General: Skin is warm and dry.     Findings: No rash.  Neurological:     General: No focal deficit present.     Mental Status: He is alert and oriented to person, place, and time.      UC Treatments / Results  Labs (all labs ordered are listed, but only abnormal results are displayed) Labs Reviewed  NOVEL CORONAVIRUS, NAA  (HOSP ORDER, SEND-OUT TO REF LAB; TAT 18-24 HRS)    EKG   Radiology No results found.  Procedures Procedures (including critical care time)  Medications Ordered in UC Medications - No data to display  Initial Impression / Assessment and Plan / UC Course  I have reviewed the triage vital signs and the nursing notes.  Pertinent labs & imaging results that were available during my care of the patient were reviewed by me and considered in my medical decision making (see chart for details).   Nausea, vomiting, and diarrhea.  Suspected COVID.  Treating with Zofran.  Discussed with patient hydration with clear liquids.  Instructed patient to return here or go to the emergency department if he is unable to stay hydrated due to excessive vomiting or diarrhea.  COVID test performed here.  Instructed patient to self quarantine until his test results are back.  Patient agrees with plan of care.     Final Clinical Impressions(s) / UC Diagnoses   Final diagnoses:  Nausea vomiting and diarrhea  Suspected Covid-19 Virus Infection     Discharge Instructions     Take the prescribed medication as needed for nausea and vomiting.    Keep yourself hydrated with 8-10 glasses/day of clear liquids such as water, Gatorade, Sprite, or ginger ale.    Return here or go to the emergency department if you are unable to stay hydrated due to excessive vomiting or diarrhea.    Your COVID test is pending.  You should self quarantine until your test result is back and is negative.          ED Prescriptions    Medication Sig Dispense Auth. Provider   ondansetron (ZOFRAN) 4 MG tablet Take 1 tablet (4 mg total) by mouth every 6 (six) hours. 12 tablet Sharion Balloon, NP     PDMP not reviewed this encounter.   Sharion Balloon, NP 01/04/19 1944

## 2019-01-04 NOTE — ED Triage Notes (Signed)
Pt report he is having diarrhea x 1 week, vomiting, back pain and  headache x 4 days.

## 2019-01-04 NOTE — Discharge Instructions (Addendum)
Take the prescribed medication as needed for nausea and vomiting.    Keep yourself hydrated with 8-10 glasses/day of clear liquids such as water, Gatorade, Sprite, or ginger ale.    Return here or go to the emergency department if you are unable to stay hydrated due to excessive vomiting or diarrhea.    Your COVID test is pending.  You should self quarantine until your test result is back and is negative.

## 2019-01-06 LAB — NOVEL CORONAVIRUS, NAA (HOSP ORDER, SEND-OUT TO REF LAB; TAT 18-24 HRS): SARS-CoV-2, NAA: NOT DETECTED

## 2019-03-22 ENCOUNTER — Encounter (HOSPITAL_COMMUNITY): Payer: Self-pay | Admitting: Emergency Medicine

## 2019-03-22 ENCOUNTER — Ambulatory Visit (HOSPITAL_COMMUNITY)
Admission: EM | Admit: 2019-03-22 | Discharge: 2019-03-22 | Disposition: A | Discharge status: Home / Self Care | Payer: Managed Care, Other (non HMO) | Attending: Emergency Medicine | Admitting: Emergency Medicine

## 2019-03-22 ENCOUNTER — Other Ambulatory Visit: Payer: Self-pay

## 2019-03-22 DIAGNOSIS — M545 Low back pain: Secondary | ICD-10-CM

## 2019-03-22 DIAGNOSIS — G8929 Other chronic pain: Secondary | ICD-10-CM

## 2019-03-22 HISTORY — DX: Scoliosis, unspecified: M41.9

## 2019-03-22 MED ORDER — IBUPROFEN 800 MG PO TABS: 800.0000 mg | ORAL_TABLET | Freq: Three times a day (TID) | ORAL | 0 refills | Status: DC

## 2019-03-22 MED ORDER — PREDNISONE 10 MG PO TABS: 20.0000 mg | ORAL_TABLET | Freq: Every day | ORAL | 0 refills | Status: DC

## 2019-03-22 MED ORDER — CYCLOBENZAPRINE HCL 5 MG PO TABS: 5.0000 mg | ORAL_TABLET | Freq: Three times a day (TID) | ORAL | 0 refills | Status: DC | PRN

## 2019-03-22 NOTE — ED Triage Notes (Signed)
Complains of mid to lower back pain, right and left.  Pain for 2 weeks.  Patient has had a headache for 4 days.    Patient took call from boss during nursing assessment.

## 2019-03-22 NOTE — Discharge Instructions (Addendum)
Advised to take medication as prescribed Take Ibuprofen with food Follow rice instruction that is attached Return if symptoms get worse

## 2019-03-22 NOTE — ED Provider Notes (Signed)
[facility]    CSN: [id number] Arrival date & time: 03/22/19  1720      History   Chief Complaint Chief Complaint  Patient presents with  . Back Pain    HPI Jesus Holloway is a 28 y.o. male.   Jesus Holloway 64 y old male present to the urgent care with a complaint of back pain that began 30 days ago. 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 He rated the pain at 9 on a scale of 1-10.  He localizes the pain to the Left low back.  He describes the pain as constant and achy.  He has tried OTC medications without relief.  His symptoms are made worse with ROM.  He stated he had similar symptoms in the past and was treated with NSAID, muscle relaxor and prednisone.   The history is provided by the patient. No language interpreter was used.    Past Medical History:  Diagnosis Date  . Scoliosis     There are no problems to display for this patient.   History reviewed. No pertinent surgical history.     Home Medications    Prior to Admission medications   Medication Sig Start Date End Date Taking? Authorizing Provider  cyclobenzaprine (FLEXERIL) 5 MG tablet Take 1 tablet (5 mg total) by mouth 3 (three) times daily as needed for muscle spasms. 03/22/19   Callie Bunyard, Zachery Dakins, FNP  ibuprofen (ADVIL) 800 MG tablet Take 1 tablet (800 mg total) by mouth 3 (three) times daily. 03/22/19   Addie Cederberg, Zachery Dakins, FNP  ondansetron (ZOFRAN) 4 MG tablet Take 1 tablet (4 mg total) by mouth every 6 (six) hours. 01/04/19   Mickie Bail, NP  predniSONE (DELTASONE) 10 MG tablet Take 2 tablets (20 mg total) by mouth daily. 03/22/19   AvegnoZachery Dakins, FNP    Family History Family History  Problem Relation Age of Onset  . Diabetes Mother   . COPD Father     Social History Social History   Tobacco Use  . Smoking status: Current Some Day Smoker    Types: Cigarettes  . Smokeless tobacco: Never Used  Substance Use Topics  . Alcohol use: Yes    Comment: socially   . Drug use: Yes    Types: Marijuana     Allergies   Patient has no known allergies.   Review of Systems Review of Systems  Constitutional: Negative.   HENT: Negative.   Respiratory: Negative.   Cardiovascular: Negative.   Musculoskeletal: Positive for back pain and myalgias.  ROS: All other are negative   Physical Exam Triage Vital Signs ED Triage Vitals [03/22/19 1745]  Enc Vitals Group     BP      Pulse      Resp      Temp      Temp src      SpO2      Weight      Height      Head Circumference      Peak Flow      Pain Score 7     Pain Loc      Pain Edu?      Excl. in GC?    No data found.  Updated Vital Signs BP 135/70 (BP Location: Left Arm)   Pulse 88   Temp 98.3 F (36.8 C) (Oral)   Resp 18   SpO2 99%   Visual Acuity Right Eye Distance:   Left Eye  Distance:   Bilateral Distance:    Right Eye Near:   Left Eye Near:    Bilateral Near:     Physical Exam Vitals and nursing note reviewed.  Constitutional:      General: He is not in acute distress.    Appearance: Normal appearance. He is normal weight. He is not ill-appearing.  Cardiovascular:     Rate and Rhythm: Regular rhythm.     Pulses: Normal pulses.     Heart sounds: Normal heart sounds. No murmur.  Pulmonary:     Effort: Pulmonary effort is normal. No respiratory distress.     Breath sounds: Normal breath sounds.  Chest:     Chest wall: No tenderness.  Musculoskeletal:        General: No swelling, deformity or signs of injury. Normal range of motion.     Lumbar back: Tenderness present. No swelling, edema, deformity, signs of trauma or lacerations. Normal range of motion.     Comments: Tenderness on back/ full ROM on hip and knee. Bilateral and equal strength on knee  Neurological:     Mental Status: He is alert and oriented to person, place, and time.     Cranial Nerves: No cranial nerve deficit.     Sensory: No sensory deficit.     Motor: No weakness.     Coordination:  Coordination normal.      UC Treatments / Results  Labs (all labs ordered are listed, but only abnormal results are displayed) Labs Reviewed - No data to display  EKG   Radiology No results found.  Procedures Procedures (including critical care time)  Medications Ordered in UC Medications - No data to display  Initial Impression / Assessment and Plan / UC Course  I have reviewed the triage vital signs and the nursing notes.  Pertinent labs & imaging results that were available during my care of the patient were reviewed by me and considered in my medical decision making (see chart for details).   Patient stable for discharge.  Benign physical exam.PE consistent with back spasm.  Instructed patient to rest, ice, heat and gentle stretches. Will prescribe a ibuprofen, Flexeril and prednisone short-term to manage symptoms.  Advised patient to follow-up with primary care or to return if symptoms get worse.  Final Clinical Impressions(s) / UC Diagnoses   Final diagnoses:  Chronic left-sided low back pain without sciatica     Discharge Instructions     Advised to take medication as prescribed Take Ibuprofen with food Follow rice instruction that is attached Return if symptoms get worse    ED Prescriptions    Medication Sig Dispense Auth. Provider   cyclobenzaprine (FLEXERIL) 5 MG tablet Take 1 tablet (5 mg total) by mouth 3 (three) times daily as needed for muscle spasms. 30 tablet Dominyck Reser S, FNP   ibuprofen (ADVIL) 800 MG tablet Take 1 tablet (800 mg total) by mouth 3 (three) times daily. 42 tablet Adalaya Irion S, FNP   predniSONE (DELTASONE) 10 MG tablet Take 2 tablets (20 mg total) by mouth daily. 10 tablet Aundrea Higginbotham, Darrelyn Hillock, FNP     PDMP not reviewed this encounter.   Emerson Monte, [place] 03/22/19 1820

## 2019-03-23 ENCOUNTER — Ambulatory Visit (HOSPITAL_COMMUNITY)
Admission: EM | Admit: 2019-03-23 | Discharge: 2019-03-23 | Disposition: A | Discharge status: Home / Self Care | Payer: Managed Care, Other (non HMO) | Attending: Family Medicine | Admitting: Family Medicine

## 2019-03-23 ENCOUNTER — Encounter (HOSPITAL_COMMUNITY): Payer: Self-pay | Admitting: Emergency Medicine

## 2019-03-23 ENCOUNTER — Other Ambulatory Visit: Payer: Self-pay

## 2019-03-23 DIAGNOSIS — Z836 Family history of other diseases of the respiratory system: Secondary | ICD-10-CM | POA: Diagnosis not present

## 2019-03-23 DIAGNOSIS — F17210 Nicotine dependence, cigarettes, uncomplicated: Secondary | ICD-10-CM | POA: Insufficient documentation

## 2019-03-23 DIAGNOSIS — R5383 Other fatigue: Secondary | ICD-10-CM

## 2019-03-23 DIAGNOSIS — U071 COVID-19: Secondary | ICD-10-CM | POA: Diagnosis not present

## 2019-03-23 DIAGNOSIS — R531 Weakness: Secondary | ICD-10-CM | POA: Diagnosis present

## 2019-03-23 DIAGNOSIS — Z833 Family history of diabetes mellitus: Secondary | ICD-10-CM | POA: Diagnosis not present

## 2019-03-23 NOTE — ED Triage Notes (Signed)
patient seen last [facility] at [facility]. Patient has had back pain for 2 weeks.  Patient has had headache and fatigue for 5 days

## 2019-03-25 ENCOUNTER — Encounter (HOSPITAL_COMMUNITY): Payer: Self-pay

## 2019-03-25 ENCOUNTER — Telehealth (HOSPITAL_COMMUNITY): Payer: Self-pay | Admitting: Emergency Medicine

## 2019-03-25 LAB — NOVEL CORONAVIRUS, NAA (HOSP ORDER, SEND-OUT TO REF LAB; TAT 18-24 HRS): SARS-CoV-2, NAA: DETECTED — AB

## 2019-03-25 NOTE — Telephone Encounter (Signed)
Your test for COVID-19 was positive, meaning that you were infected with the novel coronavirus and could give the germ to others.  Please continue isolation at home for at least 10 days since the start of your symptoms. If you do not have symptoms, please isolate at home for 10 days from the day you were tested. Once you complete your 10 day quarantine, you may return to normal activities as long as you've not had a fever for over 24 hours(without taking fever reducing medicine) and your symptoms are improving. Please continue good preventive care measures, including:  frequent hand-washing, avoid touching your face, cover coughs/sneezes, stay out of crowds and keep a 6 foot distance from others.  Go to the nearest hospital emergency room if fever/cough/breathlessness are severe or illness seems like a threat to life.  Attempted to reach patient. No answer at this time. Voicemail left.  If you have any questions, please call me at [phone]  

## 2019-03-26 NOTE — ED Provider Notes (Signed)
[facility]   [id number] 03/23/19 Arrival Time: 2376  ASSESSMENT & PLAN:  1. Fatigue, unspecified type   2. Weakness      COVID-19 testing sent. To self-quarantine until results are available. If requested, work note provided.  Follow-up Information    [facility].   Specialty: Urgent Care Why: As needed. Contact information: [place] [phone]          Reviewed expectations re: course of current medical issues. Questions answered. Outlined signs and symptoms indicating need for more acute intervention. Patient verbalized understanding. After Visit Summary given.   SUBJECTIVE: History from: patient. Seen here last evening for back pain. No change in this. Today he requests COVID testing. "Forgot to ask last night". Jesus Holloway is a 28 y.o. male. Known COVID-19 contact: none. Recent travel: none. Denies: runny nose, congestion, fever, cough, sore throat, difficulty breathing and headache. Does feel "weak and tired". Normal PO intake without n/v/d.  ROS: As per HPI.   OBJECTIVE:  Vitals:   03/23/19 1524  BP: 110/68  Pulse: (!) 53  Resp: 18  Temp: 98.1 F (36.7 C)  TempSrc: Oral  SpO2: 99%    General appearance: alert; no distress Eyes: PERRLA; EOMI; conjunctiva normal HENT: [place]; AT; nasal mucosa normal; oral mucosa normal Neck: supple  Lungs: speaks full sentences without difficulty; unlabored Heart: regular rate and rhythm Abdomen: soft, non-tender Extremities: no edema Skin: warm and dry Neurologic: normal gait Psychological: alert and cooperative; normal mood and affect  Labs:  Labs Reviewed  NOVEL CORONAVIRUS, NAA (HOSP ORDER, SEND-OUT TO REF LAB; TAT 18-24 HRS)      No Known Allergies  Past Medical History:  Diagnosis Date  . Scoliosis    Social History   Socioeconomic History  . Marital status: Single    Spouse name: Not on file  . Number  of children: Not on file  . Years of education: Not on file  . Highest education level: Not on file  Occupational History  . Not on file  Tobacco Use  . Smoking status: Current Some Day Smoker    Types: Cigarettes  . Smokeless tobacco: Never Used  Substance and Sexual Activity  . Alcohol use: Yes    Comment: socially  . Drug use: Yes    Types: Marijuana  . Sexual activity: Not on file  Other Topics Concern  . Not on file  Social History Narrative  . Not on file   Social Determinants of Health   Financial Resource Strain:   . Difficulty of Paying Living Expenses: Not on file  Food Insecurity:   . Worried About Charity fundraiser in the Last Year: Not on file  . Ran Out of Food in the Last Year: Not on file  Transportation Needs:   . Lack of Transportation (Medical): Not on file  . Lack of Transportation (Non-Medical): Not on file  Physical Activity:   . Days of Exercise per Week: Not on file  . Minutes of Exercise per Session: Not on file  Stress:   . Feeling of Stress : Not on file  Social Connections:   . Frequency of Communication with Friends and Family: Not on file  . Frequency of Social Gatherings with Friends and Family: Not on file  . Attends Religious Services: Not on file  . Active Member of Clubs or Organizations: Not on file  . Attends Archivist Meetings: Not on file  .  Marital Status: Not on file  Intimate Partner Violence:   . Fear of Current or Ex-Partner: Not on file  . Emotionally Abused: Not on file  . Physically Abused: Not on file  . Sexually Abused: Not on file   Family History  Problem Relation Age of Onset  . Diabetes Mother   . COPD Father    History reviewed. No pertinent surgical history.   Mardella Layman, MD 03/26/19 1001

## 2020-08-07 ENCOUNTER — Ambulatory Visit
Admission: EM | Admit: 2020-08-07 | Discharge: 2020-08-07 | Disposition: A | Discharge status: Home / Self Care | Payer: BC Managed Care – PPO | Attending: Emergency Medicine | Admitting: Emergency Medicine

## 2020-08-07 ENCOUNTER — Other Ambulatory Visit: Payer: Self-pay

## 2020-08-07 DIAGNOSIS — R112 Nausea with vomiting, unspecified: Secondary | ICD-10-CM | POA: Diagnosis not present

## 2020-08-07 DIAGNOSIS — R1013 Epigastric pain: Secondary | ICD-10-CM

## 2020-08-07 MED ORDER — ONDANSETRON 4 MG PO TBDP: 4.0000 mg | ORAL_TABLET | Freq: Three times a day (TID) | ORAL | 0 refills | Status: AC | PRN

## 2020-08-07 MED ORDER — ONDANSETRON 4 MG PO TBDP
4.0000 mg | ORAL_TABLET | Freq: Once | ORAL | Status: AC
  Administered 2020-08-07: 4 mg via ORAL

## 2020-08-07 NOTE — ED Provider Notes (Signed)
[facility]    CSN: [id number] Arrival date & time: 08/07/20  1124      History   Chief Complaint Chief Complaint  Patient presents with  . Vomiting    HPI Jesus Holloway is a 30 y.o. male history of scoliosis presenting today for evaluation of nausea or vomiting.  Reports that since 10 AM he has had 4 episodes of nausea and vomiting with associated abdominal pain.  Reports that he is unable to keep water down.  Does report coworkers with some similar symptoms.  Denies any chest pain, shortness of breath.  Denies diarrhea or change in bowels.  Denies URI symptoms.  HPI  Past Medical History:  Diagnosis Date  . Scoliosis     There are no problems to display for this patient.   History reviewed. No pertinent surgical history.     Home Medications    Prior to Admission medications   Medication Sig Start Date End Date Taking? Authorizing Provider  ondansetron (ZOFRAN ODT) 4 MG disintegrating tablet Take 1 tablet (4 mg total) by mouth every 8 (eight) hours as needed for nausea or vomiting. 08/07/20  Yes Jessenya Berdan, [place] C, PA-C    Family History Family History  Problem Relation Age of Onset  . Diabetes Mother   . COPD Father     Social History Social History   Tobacco Use  . Smoking status: Current Some Day Smoker    Types: Cigarettes  . Smokeless tobacco: Never Used  Substance Use Topics  . Alcohol use: Yes    Comment: socially  . Drug use: Not Currently    Types: Marijuana     Allergies   Patient has no known allergies.   Review of Systems Review of Systems  Constitutional: Negative for fatigue and fever.  HENT: Negative for congestion, sinus pressure and sore throat.   Eyes: Negative for redness, itching and visual disturbance.  Respiratory: Negative for cough and shortness of breath.   Cardiovascular: Negative for chest pain and leg swelling.  Gastrointestinal: Positive for abdominal pain, nausea and vomiting.  Musculoskeletal:  Negative for arthralgias and myalgias.  Skin: Negative for color change, rash and wound.  Neurological: Negative for dizziness, syncope, weakness, light-headedness and headaches.     Physical Exam Triage Vital Signs ED Triage Vitals [08/07/20 1300]  Enc Vitals Group     BP 130/84     Pulse Rate (!) 51     Resp 18     Temp 97.8 F (36.6 C)     Temp Source Oral     SpO2 97 %     Weight      Height      Head Circumference      Peak Flow      Pain Score 3     Pain Loc      Pain Edu?      Excl. in GC?    No data found.  Updated Vital Signs BP 130/84 (BP Location: Left Arm)   Pulse (!) 51   Temp 97.8 F (36.6 C) (Oral)   Resp 18   SpO2 97%   Visual Acuity Right Eye Distance:   Left Eye Distance:   Bilateral Distance:    Right Eye Near:   Left Eye Near:    Bilateral Near:     Physical Exam Vitals and nursing note reviewed.  Constitutional:      Appearance: He is well-developed.     Comments: No acute distress  HENT:  Head: Normocephalic and atraumatic.     Nose: Nose normal.  Eyes:     Conjunctiva/sclera: Conjunctivae normal.  Cardiovascular:     Rate and Rhythm: Normal rate.  Pulmonary:     Effort: Pulmonary effort is normal. No respiratory distress.  Abdominal:     General: There is no distension.     Comments: Tenderness to palpation of epigastrium, nontender to palpation of bilateral lower quadrants or right upper quadrant, negative rebound, negative Rovsing, negative Burney's  Musculoskeletal:        General: Normal range of motion.     Cervical back: Neck supple.  Skin:    General: Skin is warm and dry.  Neurological:     Mental Status: He is alert and oriented to person, place, and time.      UC Treatments / Results  Labs (all labs ordered are listed, but only abnormal results are displayed) Labs Reviewed - No data to display  EKG   Radiology No results found.  Procedures Procedures (including critical care time)  Medications  Ordered in UC Medications  ondansetron (ZOFRAN-ODT) disintegrating tablet 4 mg (4 mg Oral Given 08/07/20 1304)    Initial Impression / Assessment and Plan / UC Course  I have reviewed the triage vital signs and the nursing notes.  Pertinent labs & imaging results that were available during my care of the patient were reviewed by me and considered in my medical decision making (see chart for details).     Suspect likely viral gastroenteritis- negative peritoneal signs, recommending continued symptomatic and supportive care, Zofran as needed for nausea push fluids and oral rehydration.  Continue to monitor pain,Discussed strict return precautions. Patient verbalized understanding and is agreeable with plan.  Final Clinical Impressions(s) / UC Diagnoses   Final diagnoses:  Non-intractable vomiting with nausea, unspecified vomiting type  Epigastric pain     Discharge Instructions     Use Zofran dissolved in mouth as needed for nausea or vomiting Drink plenty of fluids Monitor for symptoms to gradually resolve over the next 2 to 3 days Follow-up if not improving or worsening Go to the emergency room if developing increased abdominal pain   ED Prescriptions    Medication Sig Dispense Auth. Provider   ondansetron (ZOFRAN ODT) 4 MG disintegrating tablet Take 1 tablet (4 mg total) by mouth every 8 (eight) hours as needed for nausea or vomiting. 20 tablet Divante Kotch, [place] C, PA-C     PDMP not reviewed this encounter.   Lew Dawes, [place] 08/07/20 1336

## 2020-08-07 NOTE — ED Triage Notes (Signed)
Pt c/o vomiting x4 since 10am with sharp pain to center abdomen. States unable to keep down water.

## 2020-08-07 NOTE — Discharge Instructions (Signed)
Use Zofran dissolved in mouth as needed for nausea or vomiting Drink plenty of fluids Monitor for symptoms to gradually resolve over the next 2 to 3 days Follow-up if not improving or worsening Go to the emergency room if developing increased abdominal pain

## 2021-08-08 ENCOUNTER — Ambulatory Visit
Admission: EM | Admit: 2021-08-08 | Discharge: 2021-08-08 | Disposition: A | Discharge status: Home / Self Care | Payer: BC Managed Care – PPO | Attending: Urgent Care | Admitting: Urgent Care

## 2021-08-08 DIAGNOSIS — R0981 Nasal congestion: Secondary | ICD-10-CM

## 2021-08-08 DIAGNOSIS — J069 Acute upper respiratory infection, unspecified: Secondary | ICD-10-CM

## 2021-08-08 DIAGNOSIS — R052 Subacute cough: Secondary | ICD-10-CM

## 2021-08-08 DIAGNOSIS — H9202 Otalgia, left ear: Secondary | ICD-10-CM

## 2021-08-08 DIAGNOSIS — F17200 Nicotine dependence, unspecified, uncomplicated: Secondary | ICD-10-CM

## 2021-08-08 DIAGNOSIS — H6123 Impacted cerumen, bilateral: Secondary | ICD-10-CM | POA: Diagnosis not present

## 2021-08-08 DIAGNOSIS — R519 Headache, unspecified: Secondary | ICD-10-CM

## 2021-08-08 MED ORDER — PSEUDOEPHEDRINE HCL 60 MG PO TABS: 60.0000 mg | ORAL_TABLET | Freq: Three times a day (TID) | ORAL | 0 refills | Status: AC | PRN

## 2021-08-08 MED ORDER — PREDNISONE 20 MG PO TABS: ORAL_TABLET | ORAL | 0 refills | Status: AC

## 2021-08-08 MED ORDER — BENZONATATE 100 MG PO CAPS: 100.0000 mg | ORAL_CAPSULE | Freq: Three times a day (TID) | ORAL | 0 refills | Status: AC | PRN

## 2021-08-08 MED ORDER — PROMETHAZINE-DM 6.25-15 MG/5ML PO SYRP: 5.0000 mL | ORAL_SOLUTION | Freq: Every evening | ORAL | 0 refills | Status: AC | PRN

## 2021-08-08 MED ORDER — CETIRIZINE HCL 10 MG PO TABS: 10.0000 mg | ORAL_TABLET | Freq: Every day | ORAL | 0 refills | Status: AC

## 2021-08-08 MED ORDER — CARBAMIDE PEROXIDE 6.5 % OT SOLN: 5.0000 [drp] | Freq: Two times a day (BID) | OTIC | 0 refills | Status: AC

## 2021-08-08 NOTE — ED Triage Notes (Signed)
Pt c/o left ear pain ? ?Denies cough, sore throat, nasal congestion. Did have headache yesterday. Denies ear drainage.  ? ?Onset ~2-3 days ago.  ?

## 2021-08-08 NOTE — ED Provider Notes (Signed)
?[facility] ? ? ?MRN: [id number] DOB: 12-14-1990 ? ?Subjective:  ? ?Belton Peplinski is a 31 y.o. male presenting for 3-day history of acute onset sinus congestion, coughing, throat pain, sinus headache since yesterday.  Has developed left ear pain and is having wisdom tooth pain.  Plans on going to his dentist tomorrow.  No ear drainage, tinnitus, chest pain, shortness of breath or wheezing.  Patient is a smoker, smokes cigarettes and also marijuana daily.  No history of asthma or COPD.  No confusion, weakness, numbness or tingling.  No history of stroke.  No neck pain or neck stiffness.  Patient did have 1 sick contact with his son who had a head cold and was not better. ? ?No current facility-administered medications for this encounter. ? ?Current Outpatient Medications:  ?  ondansetron (ZOFRAN ODT) 4 MG disintegrating tablet, Take 1 tablet (4 mg total) by mouth every 8 (eight) hours as needed for nausea or vomiting., Disp: 20 tablet, Rfl: 0  ? ?No Known Allergies ? ?Past Medical History:  ?Diagnosis Date  ? Scoliosis   ?  ? ?History reviewed. No pertinent surgical history. ? ?Family History  ?Problem Relation Age of Onset  ? Diabetes Mother   ? COPD Father   ? ? ?Social History  ? ?Tobacco Use  ? Smoking status: Some Days  ?  Types: Cigarettes  ? Smokeless tobacco: Never  ?Substance Use Topics  ? Alcohol use: Yes  ?  Comment: socially  ? Drug use: Not Currently  ?  Types: Marijuana  ? ? ?ROS ? ? ?Objective:  ? ?Vitals: ?BP 126/76 (BP Location: Left Arm)   Pulse 61   Temp 98.1 ?F (36.7 ?C) (Oral)   Resp 14   SpO2 94%  ? ?Physical Exam ?Constitutional:   ?   General: He is not in acute distress. ?   Appearance: Normal appearance. He is well-developed and normal weight. He is not ill-appearing, toxic-appearing or diaphoretic.  ?HENT:  ?   Head: Normocephalic and atraumatic.  ?   Right Ear: Tympanic membrane, ear canal and external ear normal. There is impacted cerumen.  ?   Left Ear: Tympanic  membrane, ear canal and external ear normal. There is impacted cerumen.  ?   Nose: Congestion present. No rhinorrhea.  ?   Mouth/Throat:  ?   Mouth: Mucous membranes are moist.  ?   Pharynx: No oropharyngeal exudate or posterior oropharyngeal erythema.  ?   Comments: Thick streaks of postnasal drainage overlying pharynx. ?Eyes:  ?   General: No scleral icterus.    ?   Right eye: No discharge.     ?   Left eye: No discharge.  ?   Extraocular Movements: Extraocular movements intact.  ?   Conjunctiva/sclera: Conjunctivae normal.  ?Cardiovascular:  ?   Rate and Rhythm: Normal rate and regular rhythm.  ?   Heart sounds: Normal heart sounds. No murmur heard. ?  No friction rub. No gallop.  ?Pulmonary:  ?   Effort: Pulmonary effort is normal. No respiratory distress.  ?   Breath sounds: Normal breath sounds. No stridor. No wheezing, rhonchi or rales.  ?Musculoskeletal:  ?   Cervical back: Normal range of motion and neck supple. No rigidity. No muscular tenderness.  ?Neurological:  ?   General: No focal deficit present.  ?   Mental Status: He is alert and oriented to person, place, and time.  ?   Cranial Nerves: No cranial nerve deficit.  ?  Motor: No weakness.  ?   Coordination: Coordination normal.  ?   Gait: Gait normal.  ?Psychiatric:     ?   Mood and Affect: Mood normal.     ?   Behavior: Behavior normal.     ?   Thought Content: Thought content normal.     ?   Judgment: Judgment normal.  ? ?Ear lavage performed using mixture of peroxide and water.  Pressure irrigation performed using a bottle and a thin ear tube.  Bilateral ear lavage.  No curette was used. ? ?Assessment and Plan :  ? ?PDMP not reviewed this encounter. ? ?1. Viral upper respiratory tract infection   ?2. Subacute cough   ?3. Left ear pain   ?4. Sinus congestion   ?5. Sinus headache   ?6. Smoker   ?7. Bilateral impacted cerumen   ? ?Successful bilateral ear lavage, continue using Debrox at home. If symptoms persist follow up with ENT.  In the context  of his smoking and respiratory symptoms, recommended a oral prednisone course.  Start Zyrtec daily.  Use pseudoephedrine as needed once the prednisone course is over. Does not meet [facility] criteria for strep testing.  Deferred imaging given clear cardiopulmonary exam, hemodynamically stable vital signs.  Otherwise, will manage for viral URI, viral syndrome. Physical exam findings reassuring and vital signs stable for discharge. Advised supportive care, offered symptomatic relief. Counseled patient on potential for adverse effects with medications prescribed/recommended today, ER and return-to-clinic precautions discussed, patient verbalized understanding.  ? ?  ?Wallis Bamberg, PA-C ?08/08/21 1940 ? ?
